# Patient Record
Sex: Male | Born: 1985 | Race: White | Hispanic: No | Marital: Single | Smoking: Current every day smoker
Health system: Southern US, Community
[De-identification: ages and names within clinical notes are randomized; demographics above are authoritative.]

---
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
## 2008-06-15 ENCOUNTER — Emergency Department (HOSPITAL_COMMUNITY): Admission: EM | Admit: 2008-06-15 | Discharge: 2008-06-15 | Payer: Self-pay | Admitting: Emergency Medicine

## 2008-10-03 ENCOUNTER — Ambulatory Visit: Payer: Self-pay | Admitting: Pulmonary Disease

## 2008-10-03 ENCOUNTER — Inpatient Hospital Stay (HOSPITAL_COMMUNITY): Admission: EM | Admit: 2008-10-03 | Discharge: 2008-10-07 | Payer: Self-pay | Admitting: Emergency Medicine

## 2009-01-05 ENCOUNTER — Emergency Department (HOSPITAL_COMMUNITY): Admission: EM | Admit: 2009-01-05 | Discharge: 2009-01-05 | Payer: Self-pay | Admitting: Emergency Medicine

## 2009-01-28 ENCOUNTER — Emergency Department (HOSPITAL_COMMUNITY): Admission: EM | Admit: 2009-01-28 | Discharge: 2009-01-28 | Payer: Self-pay | Admitting: Emergency Medicine

## 2009-05-01 ENCOUNTER — Emergency Department (HOSPITAL_COMMUNITY): Admission: EM | Admit: 2009-05-01 | Discharge: 2009-05-01 | Payer: Self-pay | Admitting: Emergency Medicine

## 2009-08-19 ENCOUNTER — Emergency Department (HOSPITAL_COMMUNITY): Admission: EM | Admit: 2009-08-19 | Discharge: 2009-08-19 | Payer: Self-pay | Admitting: Emergency Medicine

## 2009-11-08 ENCOUNTER — Emergency Department (HOSPITAL_COMMUNITY): Admission: EM | Admit: 2009-11-08 | Discharge: 2009-11-08 | Payer: Self-pay | Admitting: Emergency Medicine

## 2010-01-11 IMAGING — CR DG CHEST 1V PORT
1 series · 1 of 1 positions shown · non-contrast
Comparison: 10/03/2008

CLINICAL DATA: Respiratory distress

PORTABLE CHEST - 1 VIEW

[view not recorded]
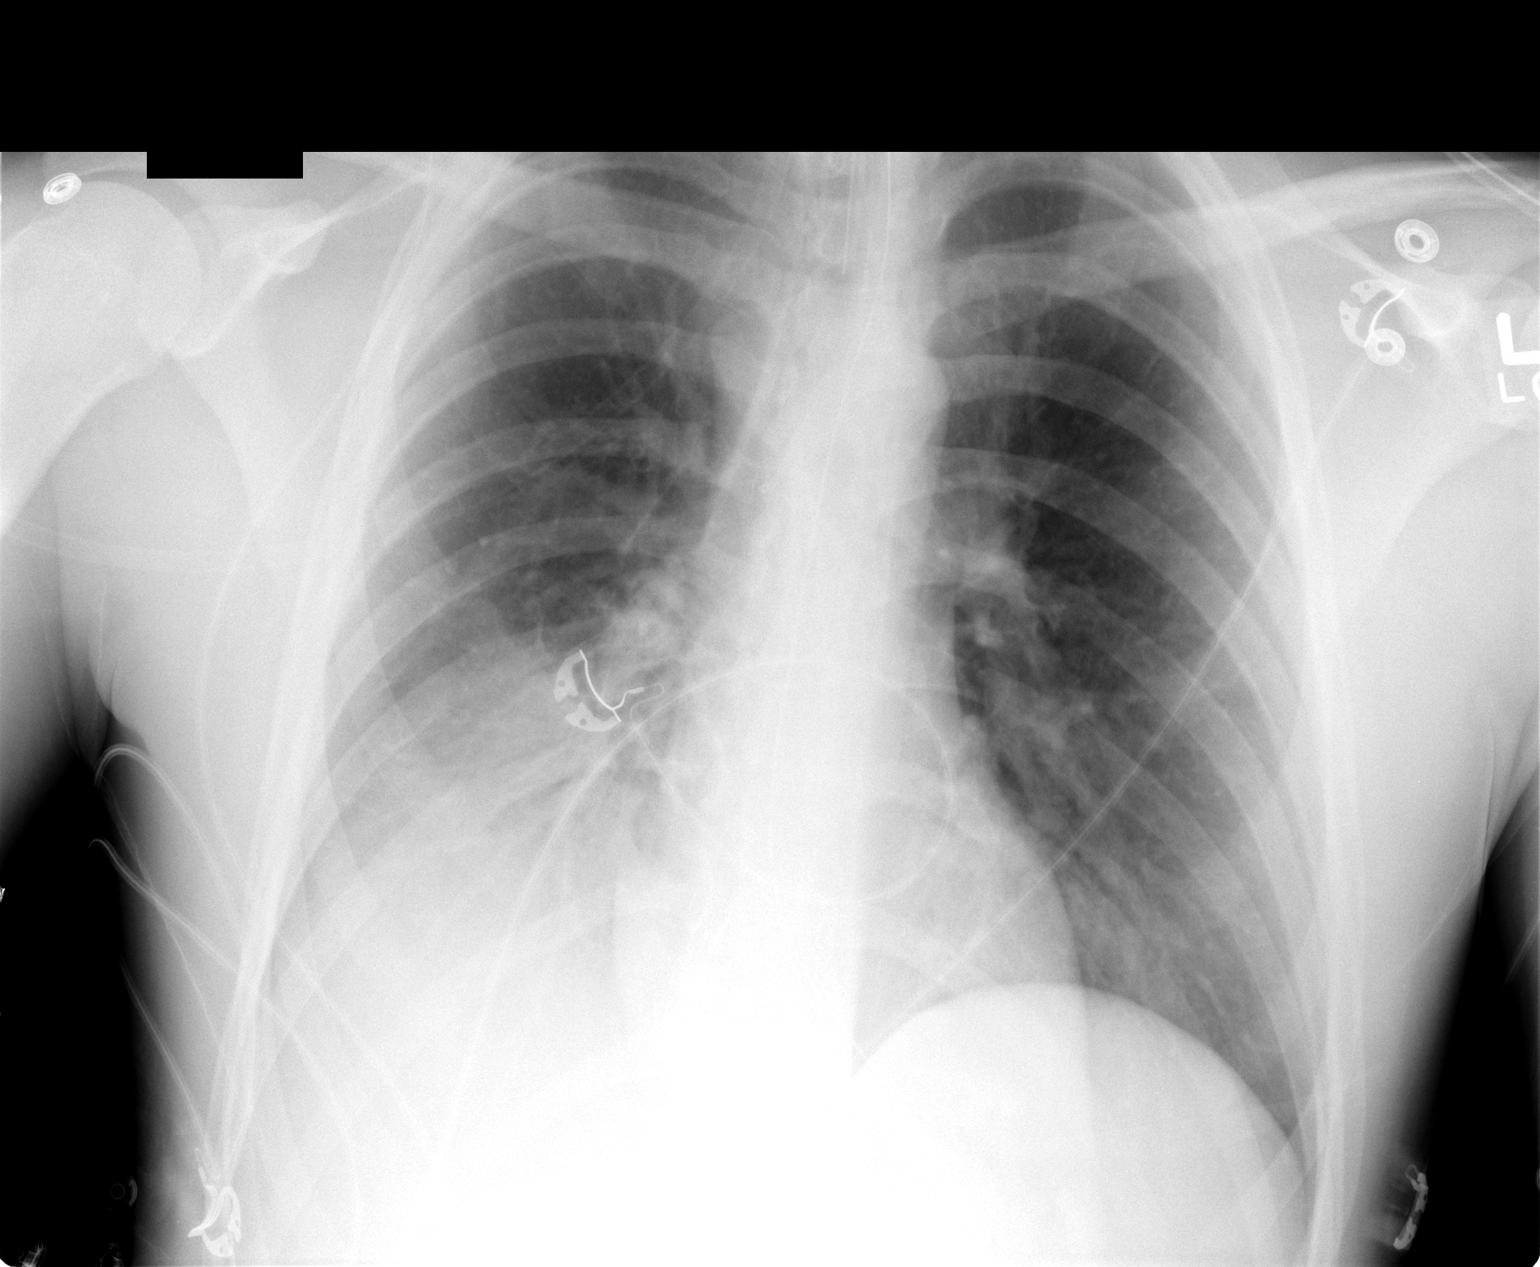

[1 of 1 positions shown; findings below may reference images not displayed]

FINDINGS: Consolidation noted in the right lower lobe compatible
with pneumonia, slightly worsened since prior study.  No focal
opacity on the left.  Heart is normal size. Support devices are
stable.
IMPRESSION: Increasing airspace disease in the right lower lobe, likely
pneumonia.

## 2010-01-12 IMAGING — CR DG CHEST 1V PORT
1 series · 1 of 1 positions shown · non-contrast
Comparison: 10/04/2008

CLINICAL DATA: Respiratory distress.

PORTABLE CHEST - 1 VIEW

[view not recorded]
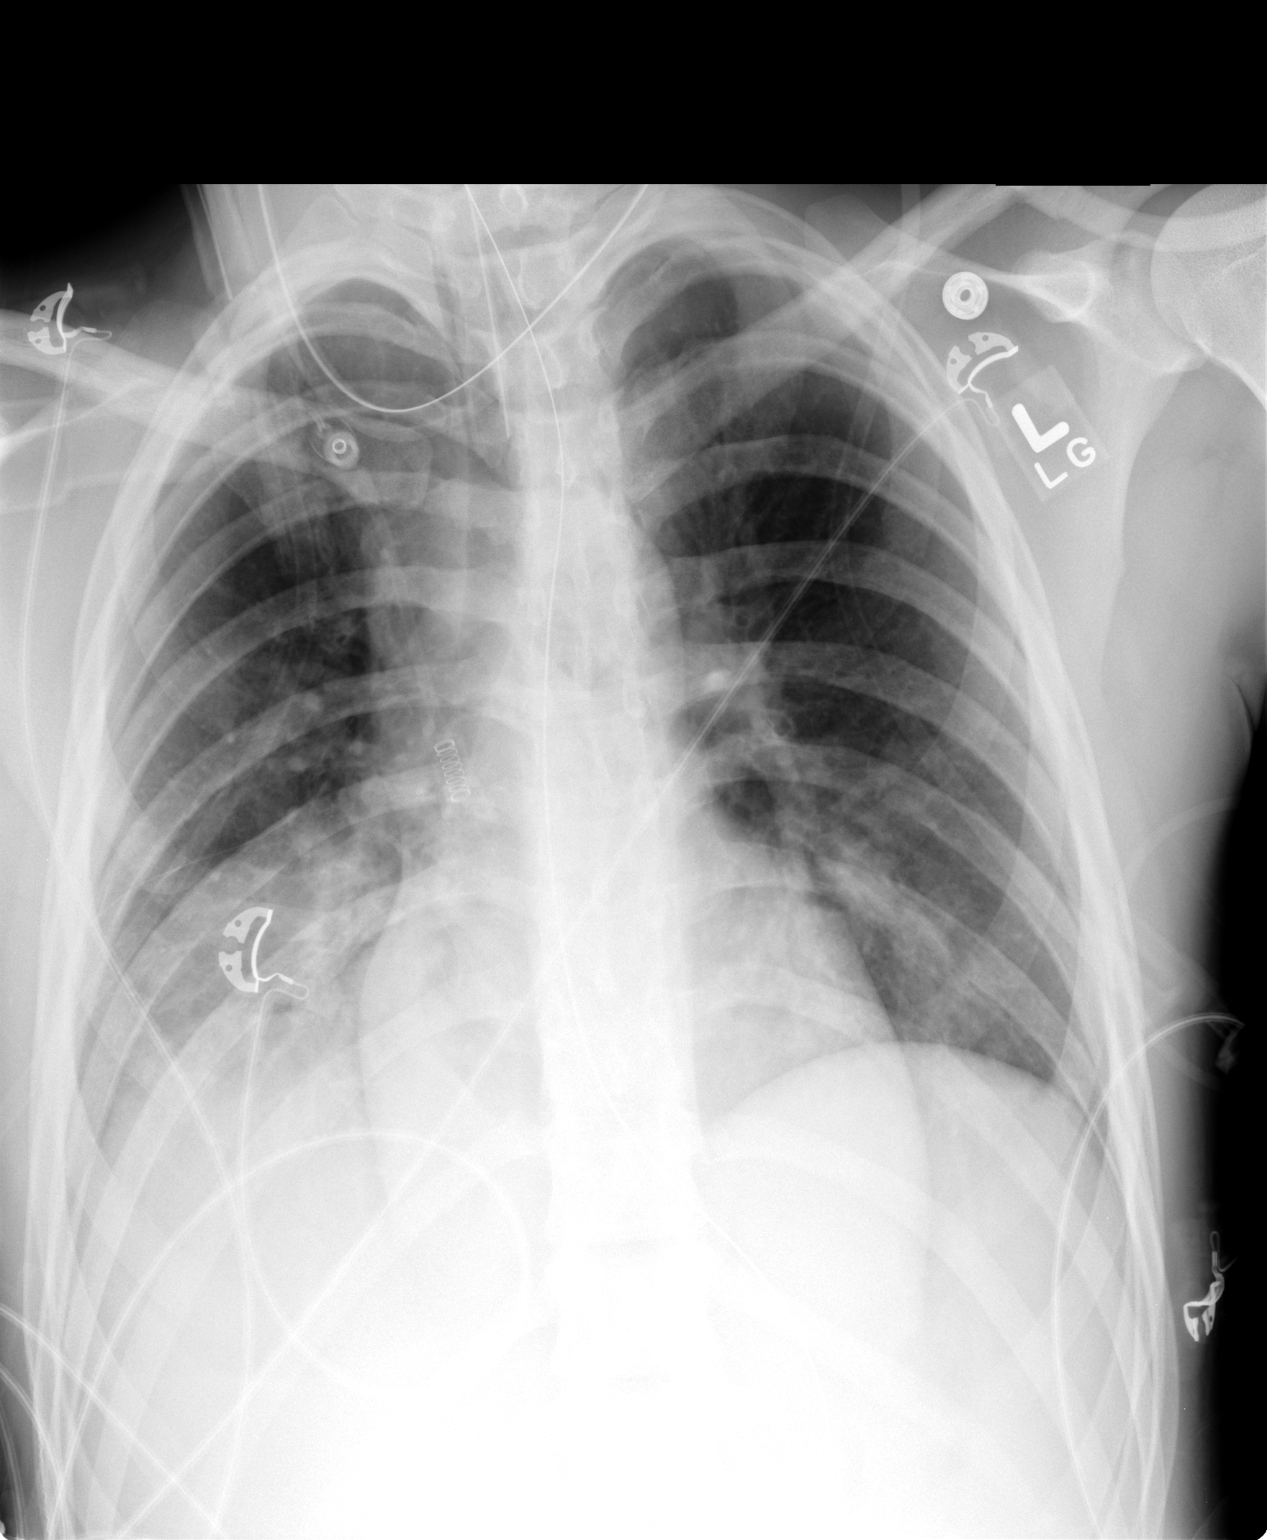

[1 of 1 positions shown; findings below may reference images not displayed]

FINDINGS: Support devices are in stable position.  Continued right
lower lobe consolidation, unchanged.  No focal opacity on the left.
No definite effusions.  Heart is borderline enlarged.
IMPRESSION: No significant change.

## 2010-01-13 IMAGING — CR DG CHEST 1V PORT
1 series · 1 of 1 positions shown · non-contrast
Comparison: 10/05/2008

CLINICAL DATA: Polysubstance abuse.

PORTABLE CHEST - 1 VIEW

[view not recorded]
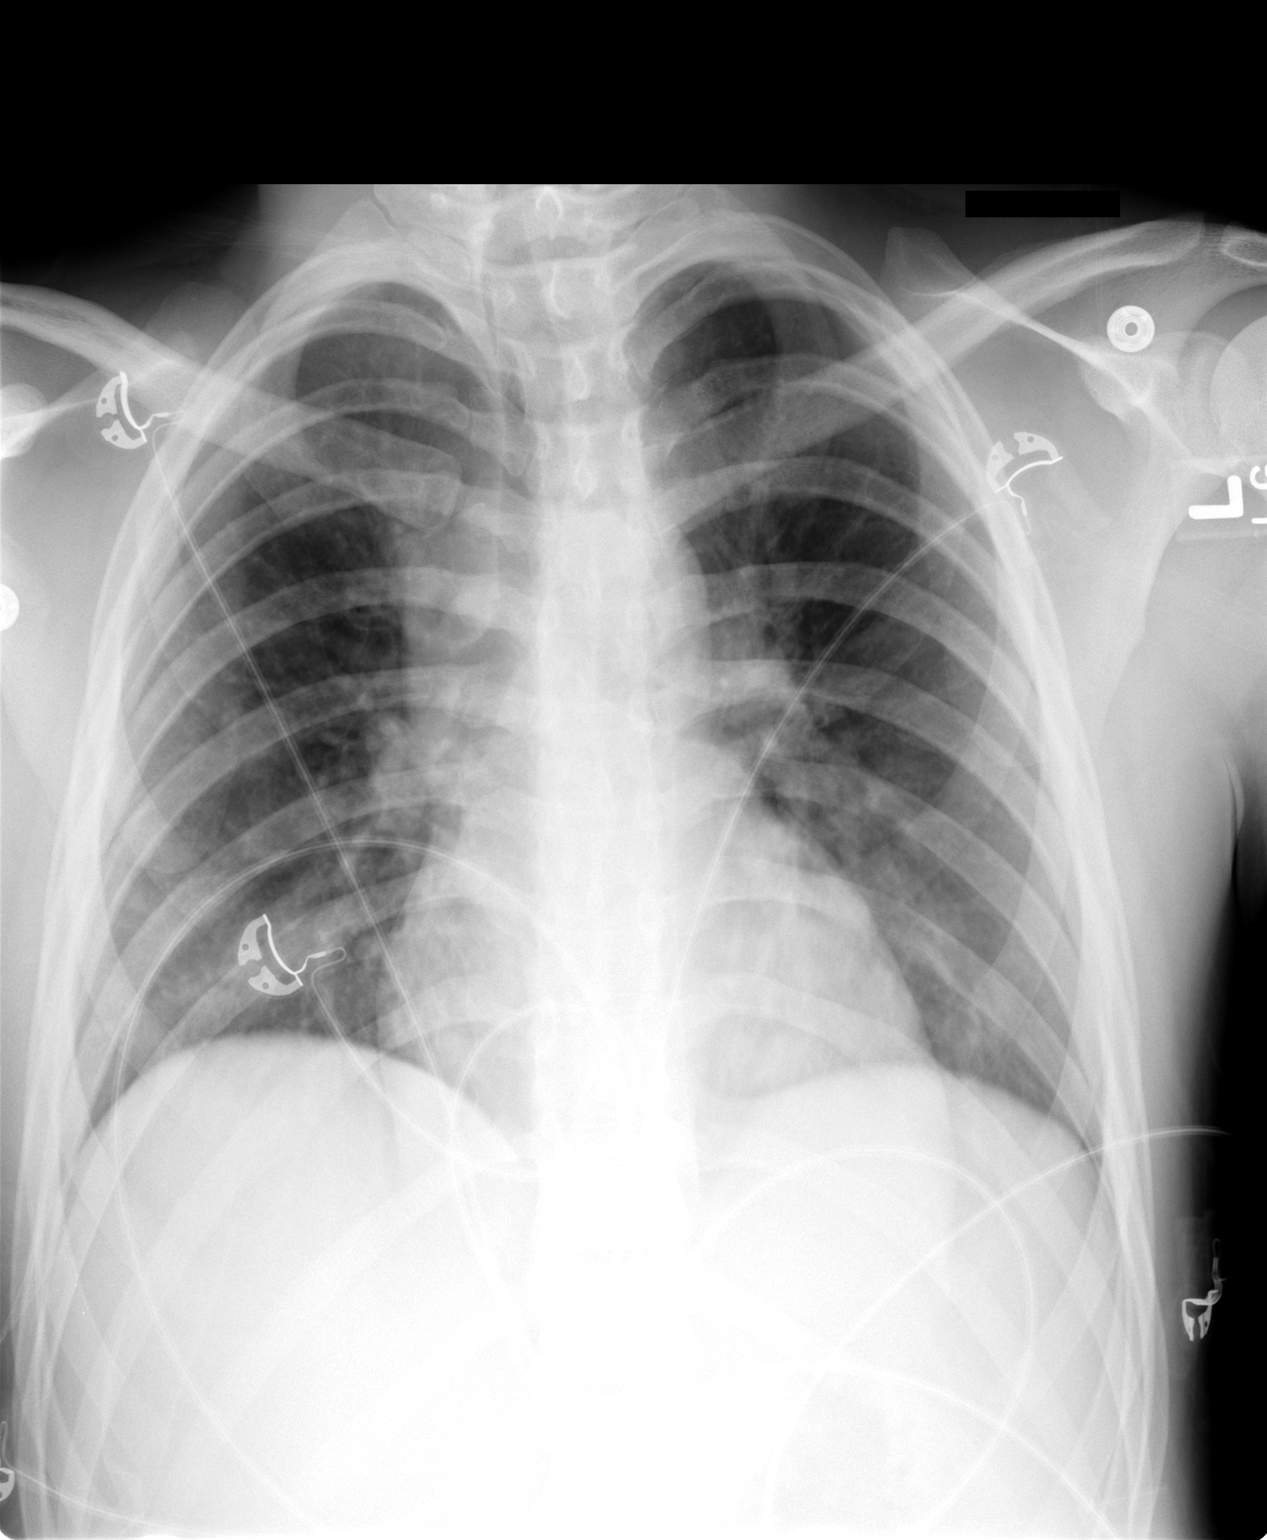

[1 of 1 positions shown; findings below may reference images not displayed]

FINDINGS: Interval extubation.  Improved aeration in the right lung
base.  Minimal bibasilar atelectasis persists.  Heart is upper
limits normal in size.  No effusions.
IMPRESSION: Interval extubation.  Improved aeration in the right base.

## 2010-01-14 IMAGING — CR DG CHEST 1V PORT
2 series · 2 of 2 positions shown · non-contrast
Comparison: 1 day prior

CLINICAL DATA: Respiratory distress.  Alcohol intoxication.
Polysubstance abuse.  Pneumonia.

PORTABLE CHEST - 1 VIEW

[view not recorded (1 of 2)]
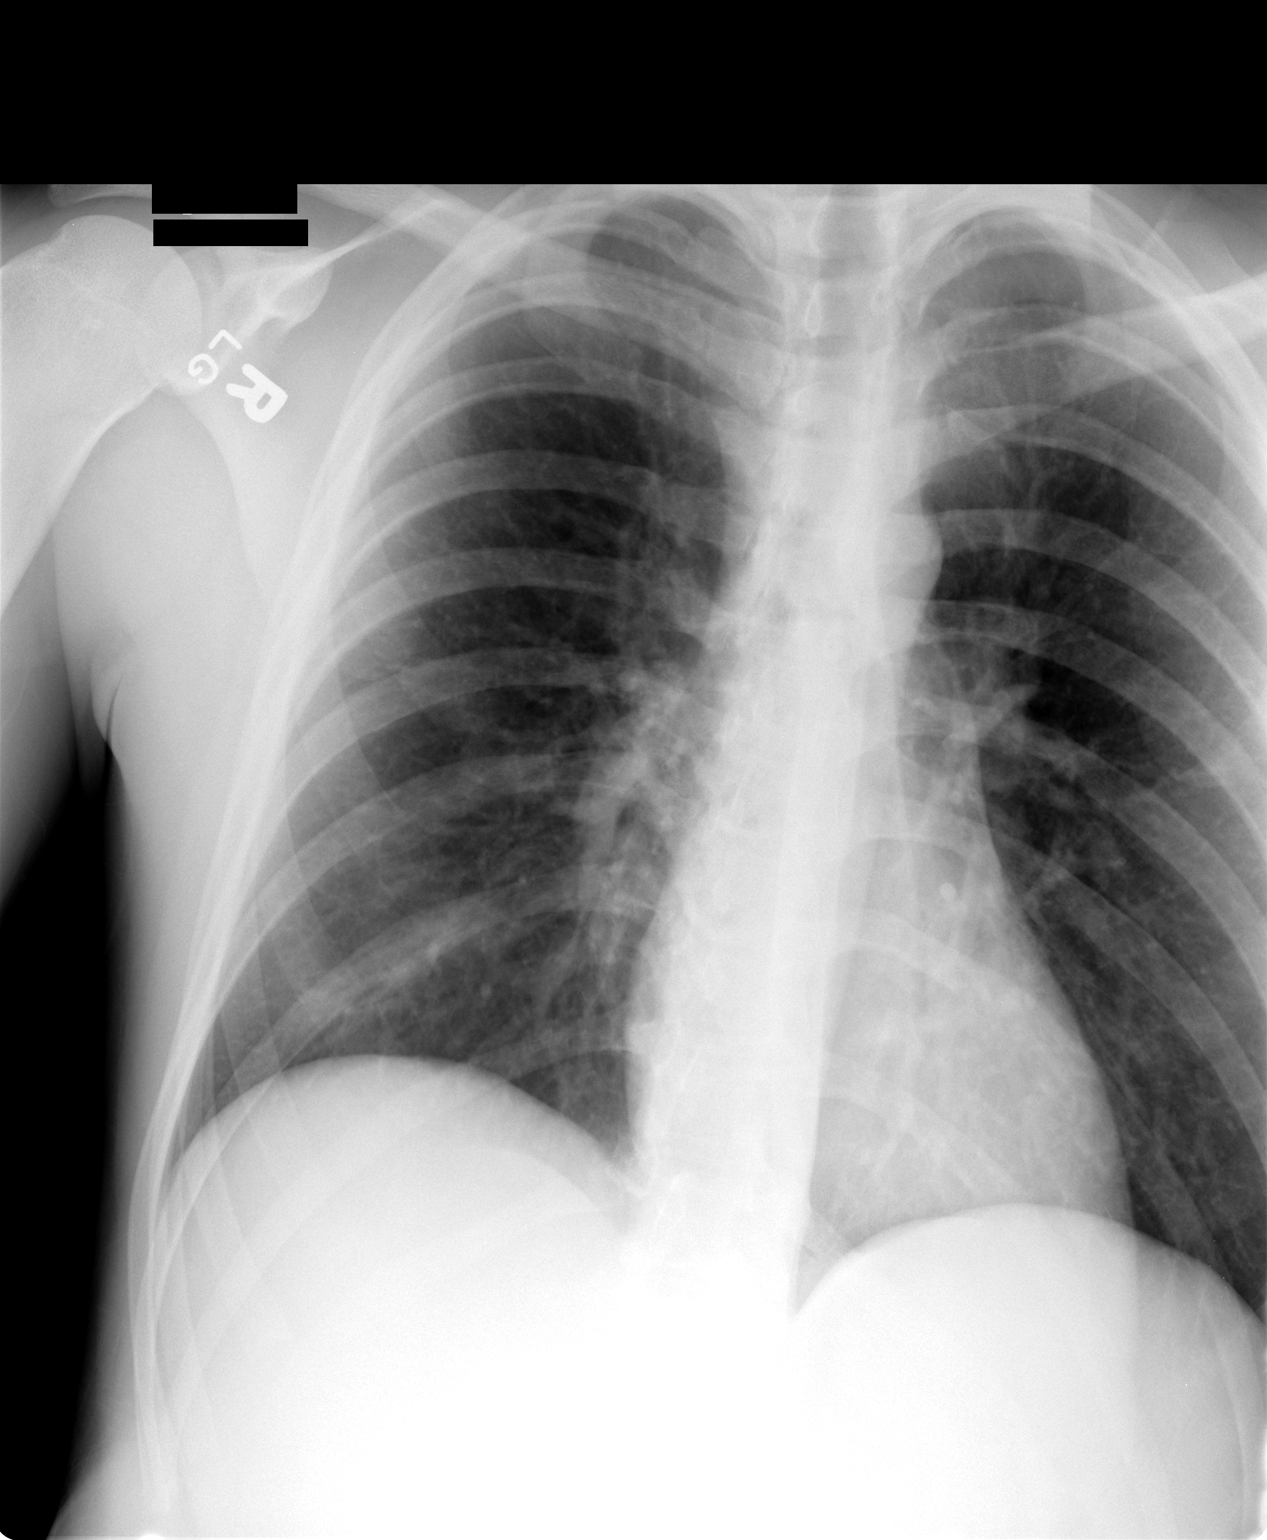

[view not recorded (2 of 2)]
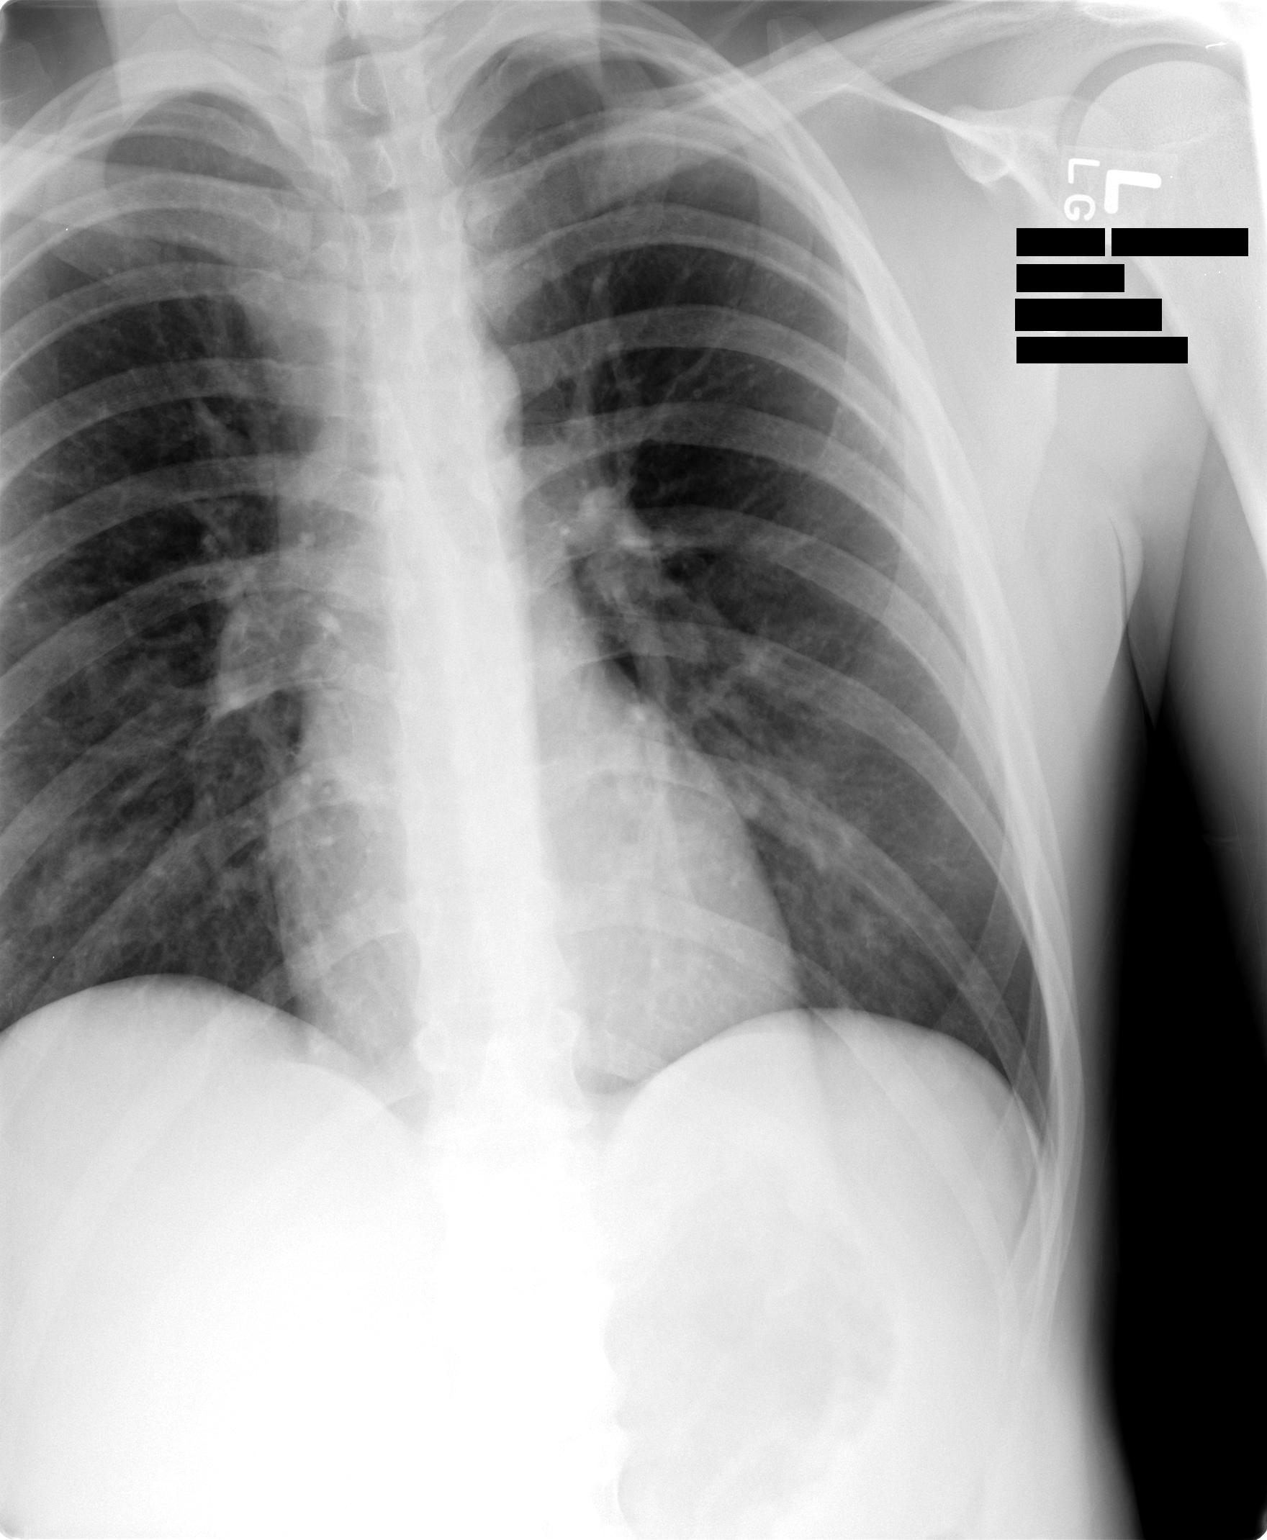

[2 of 2 positions shown; findings below may reference images not displayed]

FINDINGS: 0590 hours.  Midline trachea. Normal heart size and
mediastinal contours. No pleural effusion or pneumothorax. Further
improvement in right lower lobe aeration.  Mild air space disease
remains.  Left lung remains clear.
IMPRESSION: Improved right-sided aeration with minimal right lower lobe
airspace disease remaining.

## 2010-02-06 ENCOUNTER — Emergency Department (HOSPITAL_COMMUNITY): Admission: EM | Admit: 2010-02-06 | Discharge: 2010-02-06 | Payer: Self-pay | Admitting: Emergency Medicine

## 2011-01-08 LAB — BLOOD GAS, ARTERIAL
Acid-base deficit: 3.2 mmol/L — ABNORMAL HIGH (ref 0.0–2.0)
Bicarbonate: 26.2 mEq/L — ABNORMAL HIGH (ref 20.0–24.0)
Drawn by: 229971
FIO2: 0.99 %
FIO2: 1 %
MECHVT: 600 mL
O2 Saturation: 99.8 %
PEEP: 10 cmH2O
Patient temperature: 100.5
RATE: 14 resp/min
TCO2: 19.5 mmol/L (ref 0–100)
TCO2: 22.7 mmol/L (ref 0–100)
pCO2 arterial: 41.3 mmHg (ref 35.0–45.0)
pCO2 arterial: 45.8 mmHg — ABNORMAL HIGH (ref 35.0–45.0)
pO2, Arterial: 276 mmHg — ABNORMAL HIGH (ref 80.0–100.0)
pO2, Arterial: 90.4 mmHg (ref 80.0–100.0)

## 2011-01-08 LAB — POCT I-STAT, CHEM 8
BUN: 9 mg/dL (ref 6–23)
Hemoglobin: 18.7 g/dL — ABNORMAL HIGH (ref 13.0–17.0)
Sodium: 142 mEq/L (ref 135–145)
TCO2: 22 mmol/L (ref 0–100)

## 2011-01-08 LAB — RAPID URINE DRUG SCREEN, HOSP PERFORMED
Amphetamines: NOT DETECTED
Barbiturates: NOT DETECTED
Benzodiazepines: POSITIVE — AB
Tetrahydrocannabinol: POSITIVE — AB

## 2011-01-08 LAB — CBC
HCT: 44.3 % (ref 39.0–52.0)
HCT: 51.7 % (ref 39.0–52.0)
Hemoglobin: 14.4 g/dL (ref 13.0–17.0)
Hemoglobin: 15 g/dL (ref 13.0–17.0)
Hemoglobin: 15.4 g/dL (ref 13.0–17.0)
MCHC: 35.3 g/dL (ref 30.0–36.0)
MCHC: 35.7 g/dL (ref 30.0–36.0)
MCV: 93.1 fL (ref 78.0–100.0)
Platelets: 173 10*3/uL (ref 150–400)
Platelets: 225 10*3/uL (ref 150–400)
RBC: 4.35 MIL/uL (ref 4.22–5.81)
RBC: 4.59 MIL/uL (ref 4.22–5.81)
RBC: 4.69 MIL/uL (ref 4.22–5.81)
RDW: 12.4 % (ref 11.5–15.5)
RDW: 12.6 % (ref 11.5–15.5)
RDW: 12.7 % (ref 11.5–15.5)
WBC: 7.1 10*3/uL (ref 4.0–10.5)
WBC: 7.4 10*3/uL (ref 4.0–10.5)

## 2011-01-08 LAB — BASIC METABOLIC PANEL
CO2: 25 mEq/L (ref 19–32)
Calcium: 8.4 mg/dL (ref 8.4–10.5)
Calcium: 8.6 mg/dL (ref 8.4–10.5)
Chloride: 106 mEq/L (ref 96–112)
GFR calc Af Amer: >60 mL/min (ref >60)
GFR calc non Af Amer: >60 mL/min (ref >60)
Glucose, Bld: 129 mg/dL — ABNORMAL HIGH (ref 70–99)
Potassium: 3.6 mEq/L (ref 3.5–5.1)
Sodium: 137 mEq/L (ref 135–145)
Sodium: 140 mEq/L (ref 135–145)

## 2011-01-08 LAB — CREATININE, SERUM
Creatinine, Ser: 0.91 mg/dL (ref 0.4–1.5)
GFR calc non Af Amer: >60 mL/min (ref >60)

## 2011-01-08 LAB — DIFFERENTIAL
Basophils Absolute: 0 10*3/uL (ref 0.0–0.1)
Eosinophils Relative: 2 % (ref 0–5)
Lymphocytes Relative: 29 % (ref 12–46)
Neutrophils Relative %: 63 % (ref 43–77)

## 2011-01-08 LAB — COMPREHENSIVE METABOLIC PANEL
ALT: 20 U/L (ref 0–53)
Alkaline Phosphatase: 77 U/L (ref 39–117)
CO2: 27 mEq/L (ref 19–32)
Calcium: 9.1 mg/dL (ref 8.4–10.5)
GFR calc non Af Amer: >60 mL/min (ref >60)
Glucose, Bld: 96 mg/dL (ref 70–99)
Sodium: 139 mEq/L (ref 135–145)

## 2011-01-08 LAB — URINALYSIS, ROUTINE W REFLEX MICROSCOPIC
Bilirubin Urine: NEGATIVE
Nitrite: NEGATIVE
Specific Gravity, Urine: 1.025 (ref 1.005–1.030)
pH: 6.5 (ref 5.0–8.0)

## 2011-01-08 LAB — CULTURE, BLOOD (ROUTINE X 2): Culture: NO GROWTH

## 2011-01-08 LAB — CULTURE, RESPIRATORY W GRAM STAIN

## 2011-01-08 LAB — URINE CULTURE

## 2011-01-08 LAB — ETHANOL: Alcohol, Ethyl (B): 382 mg/dL — ABNORMAL HIGH (ref 0–10)

## 2011-01-08 LAB — TRICYCLICS SCREEN, URINE: TCA Scrn: NOT DETECTED

## 2011-02-06 NOTE — Discharge Summary (Signed)
NAMEFINNEAS, Sean Schroeder             ACCOUNT NO.:  [id number]   MEDICAL RECORD NO.:  [id number]          PATIENT TYPE:  INP   LOCATION:  1517                         FACILITY:  [facility]   PHYSICIAN:  Leslye Peer, MD    DATE OF BIRTH:  06-Dec-1985   DATE OF ADMISSION:  10/03/2008  DATE OF DISCHARGE:  10/07/2008                               DISCHARGE SUMMARY   DISCHARGE DIAGNOSES:  1. Acute respiratory failure secondary to aspiration pneumonia      (resolved).  2. Streptococcus pneumoniae pneumonia.  3. Right lower extremity blister.   PROCEDURES:  1. Oral endotracheal tube placed on day of admit, extubated on October 05, 2008.  2. Microbiology strep pneumoniae obtained from sputum bronchioalveolar      lavage on October 03, 2008.  Urine cultures and blood cultures all      negative.   BRIEF HISTORY:  A 25 year old white male patient with no significant  past medical history, brought to the emergency room via EMS after the  patient became unconscious after heavy drinking the night before.  His  initial alcohol level was 382.  Urine drug screen also positive for  cocaine, opiates, and marijuana.  He had a small scalp abrasion, CT head  was negative.  He was intubated for airway protection.  He did have  copious foul-smelling green sputum on presentation with chest x-ray with  right lower lobe airspace disease.   HOSPITAL COURSE BY DISCHARGE DIAGNOSES:  1. Acute respiratory failure secondary to aspiration pneumonia and      strep pneumoniae pneumonia.  As previously mentioned, Sean Schroeder      was admitted to the intensive care, he was initially agitated, on      mechanical ventilation.  He was maintained on mechanical      ventilation until October 05, 2008, at which time, he passed      spontaneous breathing trial successfully and was successfully      extubated and weaned quickly to room air.  He was initially started      on empiric Unasyn, cultures came back as strep  pneumoniae,      antibiotics were initially tapered to Avelox p.o., and he will be      sent home on p.o. Augmentin to complete a total 10-day course      therapy.  Upon time of discharge, his chest x-ray has cleared to      normal, with no pulmonary sequelae.  2. Right lower extremity blister.  The patient had been agitated and      had been kicking at the bed.  He developed a large blister covering      a significant portion of his right lower extremity/heel.  It was      nonerythemic, however, painful to the touch.  Therefore, the site      was prepped with chlorhexidine, and sterilely aspirated with      sterile dressing applied which did result in significant relief in      discomfort.  He was instructed to place sterile 4 x 4s on this  daily, and wear heel shoe until resolved.  He was also instructed,      should it worsen, pain come back, develop fever or chills, or      purulent discharge, to report to either our office or the nearest      Urgent Care.  He has no current primary care Sean Schroeder.  3. Agitated delirium.  This was only during ventilation, and currently      resolved.   DISCHARGE INSTRUCTIONS:  Diet regular.  Activity as tolerated.   DISCHARGE MEDICATIONS:  1. Augment 875/125 one tablet twice a day x5 days.  2. Motrin over the counter 200 mg tablets, instructed to take 4 every      8 hours as needed for pain.   FOLLOWUP:  Nurse practitioner, Sean Schroeder on October 21, 2008, at 9:30.   PHYSICAL EXAMINATION UPON TIME OF DISCHARGE:  GENERAL:  Currently, [name] is hemodynamically stable.  VITAL SIGNS:  He is afebrile with vital signs stable.  HEENT:  Normocephalic.  No JVD or adenopathy.  PULMONARY:  Clear to auscultation.  CARDIAC:  Regular rate and rhythm.  ABDOMEN:  Soft and nontender.  EXTREMITIES:  As noted before with right heel blister which is swollen,  now dressed in sterile 4 x 4.  NEUROLOGIC:  Grossly intact.   DISPOSITION:  He has now maximum  benefit from inpatient stay.  He is  cleared for hospital discharge to home.       Zenia Resides, NP      Leslye Peer, MD  Electronically Signed    PB/MEDQ  D:  10/07/2008  T:  10/08/2008  Job:  [id number]

## 2011-06-25 LAB — RAPID STREP SCREEN (MED CTR MEBANE ONLY): Streptococcus, Group A Screen (Direct): NEGATIVE

## 2011-06-26 ENCOUNTER — Emergency Department (HOSPITAL_COMMUNITY)
Admission: EM | Admit: 2011-06-26 | Discharge: 2011-06-26 | Disposition: A | Discharge status: Home / Self Care | Payer: Self-pay | Attending: Emergency Medicine | Admitting: Emergency Medicine

## 2011-06-26 DIAGNOSIS — F1010 Alcohol abuse, uncomplicated: Secondary | ICD-10-CM | POA: Insufficient documentation

## 2011-06-26 DIAGNOSIS — R4182 Altered mental status, unspecified: Secondary | ICD-10-CM | POA: Insufficient documentation

## 2011-06-26 LAB — BASIC METABOLIC PANEL
BUN: 11 mg/dL (ref 6–23)
CO2: 22 mEq/L (ref 19–32)
Chloride: 102 mEq/L (ref 96–112)
Creatinine, Ser: 1.22 mg/dL (ref 0.50–1.35)
Glucose, Bld: 102 mg/dL — ABNORMAL HIGH (ref 70–99)

## 2011-06-26 LAB — RAPID URINE DRUG SCREEN, HOSP PERFORMED
Benzodiazepines: NOT DETECTED
Cocaine: POSITIVE — AB

## 2011-06-26 LAB — DIFFERENTIAL
Eosinophils Absolute: 0 10*3/uL (ref 0.0–0.7)
Eosinophils Relative: 0 % (ref 0–5)
Lymphs Abs: 1.6 10*3/uL (ref 0.7–4.0)
Monocytes Relative: 6 % (ref 3–12)

## 2011-06-26 LAB — CBC
MCH: 32.8 pg (ref 26.0–34.0)
MCV: 88.7 fL (ref 78.0–100.0)
Platelets: 200 10*3/uL (ref 150–400)
RDW: 12.2 % (ref 11.5–15.5)

## 2011-06-27 ENCOUNTER — Emergency Department (HOSPITAL_COMMUNITY): Payer: Self-pay

## 2011-06-27 ENCOUNTER — Emergency Department (HOSPITAL_COMMUNITY)
Admission: EM | Admit: 2011-06-27 | Discharge: 2011-06-27 | Disposition: A | Discharge status: Home / Self Care | Payer: Self-pay | Attending: Emergency Medicine | Admitting: Emergency Medicine

## 2011-06-27 DIAGNOSIS — R296 Repeated falls: Secondary | ICD-10-CM | POA: Insufficient documentation

## 2011-06-27 DIAGNOSIS — S93409A Sprain of unspecified ligament of unspecified ankle, initial encounter: Secondary | ICD-10-CM | POA: Insufficient documentation

## 2011-06-27 DIAGNOSIS — M25539 Pain in unspecified wrist: Secondary | ICD-10-CM | POA: Insufficient documentation

## 2011-06-27 DIAGNOSIS — IMO0002 Reserved for concepts with insufficient information to code with codable children: Secondary | ICD-10-CM | POA: Insufficient documentation

## 2012-03-12 ENCOUNTER — Emergency Department (HOSPITAL_COMMUNITY)
Admission: EM | Admit: 2012-03-12 | Discharge: 2012-03-12 | Disposition: A | Discharge status: Home / Self Care | Payer: Self-pay | Attending: Emergency Medicine | Admitting: Emergency Medicine

## 2012-03-12 ENCOUNTER — Emergency Department (HOSPITAL_COMMUNITY): Payer: Self-pay

## 2012-03-12 ENCOUNTER — Encounter (HOSPITAL_COMMUNITY): Payer: Self-pay

## 2012-03-12 DIAGNOSIS — W1789XA Other fall from one level to another, initial encounter: Secondary | ICD-10-CM | POA: Insufficient documentation

## 2012-03-12 DIAGNOSIS — S93409A Sprain of unspecified ligament of unspecified ankle, initial encounter: Secondary | ICD-10-CM | POA: Insufficient documentation

## 2012-03-12 MED ORDER — HYDROCODONE-ACETAMINOPHEN 5-325 MG PO TABS: 1.0000 | ORAL_TABLET | Freq: Four times a day (QID) | ORAL | Status: AC | PRN

## 2012-03-12 NOTE — Discharge Instructions (Signed)
Ankle Sprain An ankle sprain is an injury to the strong, fibrous tissues (ligaments) that hold the bones of your ankle joint together.  CAUSES Ankle sprain usually is caused by a fall or by twisting your ankle. People who participate in sports are more prone to these types of injuries.  SYMPTOMS  Symptoms of ankle sprain include:  Pain in your ankle. The pain may be present at rest or only when you are trying to stand or walk.   Swelling.   Bruising. Bruising may develop immediately or within 1 to 2 days after your injury.   Difficulty standing or walking.  DIAGNOSIS  Your caregiver will ask you details about your injury and perform a physical exam of your ankle to determine if you have an ankle sprain. During the physical exam, your caregiver will press and squeeze specific areas of your foot and ankle. Your caregiver will try to move your ankle in certain ways. An X-ray exam may be done to be sure a bone was not broken or a ligament did not separate from one of the bones in your ankle (avulsion).  TREATMENT  Certain types of braces can help stabilize your ankle. Your caregiver can make a recommendation for this. Your caregiver may recommend the use of medication for pain. If your sprain is severe, your caregiver may refer you to a surgeon who helps to restore function to parts of your skeletal system (orthopedist) or a physical therapist. HOME CARE INSTRUCTIONS  Apply ice to your injury for 1 to 2 days or as directed by your caregiver. Applying ice helps to reduce inflammation and pain.  Put ice in a plastic bag.   Place a towel between your skin and the bag.   Leave the ice on for 15 to 20 minutes at a time, every 2 hours while you are awake.   Take over-the-counter or prescription medicines for pain, discomfort, or fever only as directed by your caregiver.   Keep your injured leg elevated, when possible, to lessen swelling.   If your caregiver recommends crutches, use them as  instructed. Gradually, put weight on the affected ankle. Continue to use crutches or a cane until you can walk without feeling pain in your ankle.   If you have a plaster splint, wear the splint as directed by your caregiver. Do not rest it on anything harder than a pillow the first 24 hours. Do not put weight on it. Do not get it wet. You may take it off to take a shower or bath.   You may have been given an elastic bandage to wear around your ankle to provide support. If the elastic bandage is too tight (you have numbness or tingling in your foot or your foot becomes cold and blue), adjust the bandage to make it comfortable.   If you have an air splint, you may blow more air into it or let air out to make it more comfortable. You may take your splint off at night and before taking a shower or bath.   Wiggle your toes in the splint several times per day if you are able.  SEEK MEDICAL CARE IF:   You have an increase in bruising, swelling, or pain.   Your toes feel cold.   Pain relief is not achieved with medication.  SEEK IMMEDIATE MEDICAL CARE IF: Your toes are numb or blue or you have severe pain. MAKE SURE YOU:   Understand these instructions.   Will watch your condition.     Will get help right away if you are not doing well or get worse.  Document Released: 09/10/2005 Document Revised: 08/30/2011 Document Reviewed: 04/14/2008 [employer] Patient Information 2012 [employer]. 

## 2012-03-12 NOTE — Progress Notes (Signed)
Orthopedic Tech Progress Note Patient Details:  Sean Schroeder 07-27-1986 [id number]  Ortho Devices Type of Ortho Device: Crutches;Ankle Air splint Ortho Device/Splint Location: right ankle Ortho Device/Splint Interventions: Application   Asia R Thompson 03/12/2012, 9:43 PM

## 2012-03-12 NOTE — ED Provider Notes (Signed)
History   This chart was scribed for Sean Munch, MD by Shari Heritage. The patient was seen in room TR05C/TR05C. Patient's care was started at 1621.   CSN: [id number]  Arrival date & time 03/12/12  1621   First MD Initiated Contact with Patient 03/12/12 1935      Chief Complaint  Patient presents with  . Fall  . Ankle Pain    (Consider location/radiation/quality/duration/timing/severity/associated sxs/prior treatment) Patient is a 26 y.o. male presenting with fall and ankle pain. The history is provided by the patient. No language interpreter was used.  Fall The accident occurred 3 to 5 hours ago. The fall occurred while walking and while recreating/playing. He fell from a height of 3 to 5 ft. He landed on dirt. The pain is moderate. He was not ambulatory at the scene. There was no entrapment after the fall. Pertinent negatives include no vomiting.  Ankle Pain  The incident occurred 3 to 5 hours ago. The pain is present in the right ankle. The pain is moderate. The pain has been constant since onset.   Sean Schroeder is a 26 y.o. male who presents to the Emergency Department complaining of a fall with associated constant, moderate to severe right ankle pain and swelling. Patient says he stepped into a hole and rolled his ankle several hours ago. Patient denies any other pertinent medical or surgical history.   History reviewed. No pertinent past medical history.  History reviewed. No pertinent past surgical history.  History reviewed. No pertinent family history.  History  Substance Use Topics  . Smoking status: Not on file  . Smokeless tobacco: Not on file  . Alcohol Use: Not on file      Review of Systems  Constitutional:       Per HPI, otherwise negative  HENT:       Per HPI, otherwise negative  Eyes: Negative.   Respiratory:       Per HPI, otherwise negative  Cardiovascular:       Per HPI, otherwise negative  Gastrointestinal: Negative for vomiting.    Genitourinary: Negative.   Musculoskeletal:       Per HPI, otherwise negative  Skin: Negative.   Neurological: Negative for syncope.    Allergies  Review of patient's allergies indicates no known allergies.  Home Medications   Current Outpatient Rx  Name Route Sig Dispense Refill  . HYDROCODONE-ACETAMINOPHEN 5-500 MG PO TABS Oral Take 1 tablet by mouth every 6 (six) hours as needed. For pain    . IBUPROFEN 200 MG PO TABS Oral Take 1,600 mg by mouth every 8 (eight) hours as needed. For pain      BP 140/80  Pulse 113  Temp 98.2 F (36.8 C) (Oral)  Resp 20  SpO2 97%  Physical Exam  Nursing note and vitals reviewed. Constitutional: He is oriented to person, place, and time. He appears well-developed. No distress.  HENT:  Head: Normocephalic and atraumatic.  Eyes: Conjunctivae and EOM are normal.  Cardiovascular: Normal rate and regular rhythm.   Pulmonary/Chest: Effort normal. No stridor. No respiratory distress.  Abdominal: He exhibits no distension.  Musculoskeletal: He exhibits no edema.       Right ankle - Patient has anterior medial and posterior medial malleolus pain.  Neurological: He is alert and oriented to person, place, and time.  Skin: Skin is warm and dry.  Psychiatric: He has a normal mood and affect.    ED Course  Procedures (including critical care time) DIAGNOSTIC STUDIES:  COORDINATION OF CARE: 7:40PM- Patient informed of current plan for treatment and evaluation and agrees with plan at this time. X-ray of right ankle shows no fracture. Will prescribe pain medication and provide air splint and crutches.  Dg Ankle Complete Right  03/12/2012  *RADIOLOGY REPORT*  Clinical Data: Fall, ankle pain  RIGHT ANKLE - COMPLETE 3+ VIEW  Comparison: None.  Findings: Three views of the right ankle submitted.  No acute fracture or subluxation.  Soft tissue swelling noted adjacent to lateral malleolus.  Ankle mortise is preserved.  IMPRESSION: No acute fracture or  subluxation.  Soft tissue swelling adjacent to lateral malleolus.  Original Report Authenticated By: Natasha Mead, M.D.   X-ray reviewed by me  No diagnosis found.    MDM  I personally performed the services described in this documentation, which was scribed in my presence. The recorded information has been reviewed and considered.  This generally well male presents with new pain in his right ankle.  None patient is uncomfortable appearing, with physical exam findings suggestive of sprain.  The patient's x-ray does not demonstrate acute fracture.  The patient was discharged in stable condition after his ankle was immobilized with an air splint.  Sean Munch, MD 03/13/12 [phone]

## 2012-03-12 NOTE — ED Notes (Signed)
Stepped wrong into hole last pm and rolled right ankle. Swelling and pain noted to same

## 2014-05-12 ENCOUNTER — Telehealth: Payer: Self-pay

## 2014-05-12 NOTE — Telephone Encounter (Signed)
Close encounter 

## 2017-10-26 ENCOUNTER — Encounter (HOSPITAL_COMMUNITY): Payer: Self-pay

## 2017-10-26 ENCOUNTER — Emergency Department (HOSPITAL_COMMUNITY): Payer: Self-pay

## 2017-10-26 ENCOUNTER — Emergency Department (HOSPITAL_COMMUNITY)
Admission: EM | Admit: 2017-10-26 | Discharge: 2017-10-26 | Disposition: A | Discharge status: Home / Self Care | Payer: Self-pay | Attending: Emergency Medicine | Admitting: Emergency Medicine

## 2017-10-26 DIAGNOSIS — Y9389 Activity, other specified: Secondary | ICD-10-CM | POA: Insufficient documentation

## 2017-10-26 DIAGNOSIS — S61217A Laceration without foreign body of left little finger without damage to nail, initial encounter: Secondary | ICD-10-CM | POA: Insufficient documentation

## 2017-10-26 DIAGNOSIS — Y9289 Other specified places as the place of occurrence of the external cause: Secondary | ICD-10-CM | POA: Insufficient documentation

## 2017-10-26 DIAGNOSIS — Y999 Unspecified external cause status: Secondary | ICD-10-CM | POA: Insufficient documentation

## 2017-10-26 DIAGNOSIS — X58XXXA Exposure to other specified factors, initial encounter: Secondary | ICD-10-CM | POA: Insufficient documentation

## 2017-10-26 MED ORDER — IBUPROFEN 800 MG PO TABS
800.0000 mg | ORAL_TABLET | Freq: Once | ORAL | Status: AC
  Administered 2017-10-26: 800 mg via ORAL
  Filled 2017-10-26: qty 1

## 2017-10-26 MED ORDER — LIDOCAINE-EPINEPHRINE (PF) 2 %-1:200000 IJ SOLN
20.0000 mL | Freq: Once | INTRAMUSCULAR | Status: DC
  Filled 2017-10-26: qty 20

## 2017-10-26 NOTE — Discharge Instructions (Signed)
Keep area clean and dry. You can wash with soap and water Change bandage at least once daily, more if it is dirty Watch for signs of infection (redness, drainage) Return if worsening

## 2017-10-26 NOTE — ED Notes (Signed)
Dressing apply to little left finger.

## 2017-10-26 NOTE — ED Provider Notes (Signed)
[facility]-EMERGENCY DEPT Provider Note   CSN: [id number] Arrival date & time: 10/26/17  1624     History   Chief Complaint Chief Complaint  Patient presents with  . Laceration    HPI Sean Schroeder is a 32 y.o. male who presents with a laceration of the left pinky finger. No significant PMH. He states that he was using a pressure washer about 1 hour ago when it slipped and hit his left pinky finger. Bleeding was difficult to control. He has associated swelling of the finger. He is able to bend the finger with moderate pain. He has not taken any pain medicine PTA.  HPI  History reviewed. No pertinent past medical history.  There are no active problems to display for this patient.   No past surgical history on file.     Home Medications    Prior to Admission medications   Medication Sig Start Date End Date Taking? Authorizing Provider  HYDROcodone-acetaminophen (VICODIN) 5-500 MG per tablet Take 1 tablet by mouth every 6 (six) hours as needed. For pain    [provider]  ibuprofen (ADVIL,MOTRIN) 200 MG tablet Take 1,600 mg by mouth every 8 (eight) hours as needed. For pain    [provider]    Family History No family history on file.  Social History Social History   Tobacco Use  . Smoking status: Not on file  Substance Use Topics  . Alcohol use: Not on file  . Drug use: Not on file     Allergies   Patient has no known allergies.   Review of Systems Review of Systems  Skin: Positive for wound.  Neurological: Negative for weakness and numbness.     Physical Exam Updated Vital Signs BP 139/89 (BP Location: Right Arm)   Pulse (!) 106   Temp 98.9 F (37.2 C) (Oral)   Resp 20   SpO2 98%   Physical Exam  Constitutional: He is oriented to person, place, and time. He appears well-developed and well-nourished. No distress.  HENT:  Head: Normocephalic and atraumatic.  Eyes: Conjunctivae are normal. Pupils are  equal, round, and reactive to light. Right eye exhibits no discharge. Left eye exhibits no discharge. No scleral icterus.  Neck: Normal range of motion.  Cardiovascular: Normal rate.  Pulmonary/Chest: Effort normal. No respiratory distress.  Abdominal: He exhibits no distension.  Musculoskeletal:  Left pinky finger: Small skin avulsions which are not amenable to suturing. Bleeding is controlled. Decreased ROM of DIP joint due to pain and swelling. N/V intact.   Neurological: He is alert and oriented to person, place, and time.  Skin: Skin is warm and dry.  Psychiatric: He has a normal mood and affect. His behavior is normal.  Nursing note and vitals reviewed.    ED Treatments / Results  Labs (all labs ordered are listed, but only abnormal results are displayed) Labs Reviewed - No data to display  EKG  EKG Interpretation None       Radiology Dg Finger Little Left  Result Date: 10/26/2017 CLINICAL DATA:  Finger laceration along the anterior and lateral aspect from pressure washing today. EXAM: LEFT LITTLE FINGER 2+V COMPARISON:  None. FINDINGS: Moderate diffuse soft tissue emphysema is noted of the left pinky extending into the webspace and level of the fifth MCP and metacarpal. This is presumably on the basis of soft tissue laceration. No underlying osseous abnormality or joint dislocation. No radiopaque foreign body. IMPRESSION: Soft tissue emphysema of the soft tissues  surrounding the left pinky without underlying fracture, joint dislocation or radiopaque foreign body. This is presumably on the basis of soft tissue laceration given patient history. Electronically Signed   By: Tollie Ethavid  Kwon M.D.   On: 10/26/2017 17:50    Procedures Procedures (including critical care time)  Medications Ordered in ED Medications  ibuprofen (ADVIL,MOTRIN) tablet 800 mg (800 mg Oral Given 10/26/17 1840)     Initial Impression / Assessment and Plan / ED Course  I have reviewed the triage vital signs  and the nursing notes.  Pertinent labs & imaging results that were available during my care of the patient were reviewed by me and considered in my medical decision making (see chart for details).  32 year old male with left pinky finger laceration. It was not amenable to suturing. It was irrigated in the ED. Bottom of the wound visualized and bleeding controlled. Wound care discussed. Return precautions discussed.    Final Clinical Impressions(s) / ED Diagnoses   Final diagnoses:  Laceration of left little finger without foreign body without damage to nail, initial encounter    ED Discharge Orders    None       Bethel BornGekas, Dylon Correa Marie, PA-C 10/26/17 [address]el [id number]    Wentz, Elliott, MD 10/26/17 [phone]

## 2017-10-26 NOTE — ED Triage Notes (Addendum)
Patient here with left pinky finger laceration from pressure wash. Pt is rating his pain at 9/10.  Pt state it happen while he was washing a car and trying to get sticker off the car.

## 2022-12-29 ENCOUNTER — Other Ambulatory Visit: Payer: Self-pay

## 2022-12-29 ENCOUNTER — Encounter (HOSPITAL_COMMUNITY): Payer: Self-pay

## 2022-12-29 ENCOUNTER — Emergency Department (HOSPITAL_COMMUNITY)
Admission: EM | Admit: 2022-12-29 | Discharge: 2022-12-29 | Disposition: A | Discharge status: Home / Self Care | Payer: Self-pay | Attending: Emergency Medicine | Admitting: Emergency Medicine

## 2022-12-29 DIAGNOSIS — L02212 Cutaneous abscess of back [any part, except buttock]: Secondary | ICD-10-CM | POA: Insufficient documentation

## 2022-12-29 DIAGNOSIS — L0291 Cutaneous abscess, unspecified: Secondary | ICD-10-CM

## 2022-12-29 MED ORDER — OXYCODONE HCL 5 MG PO TABS
5.0000 mg | ORAL_TABLET | Freq: Once | ORAL | Status: AC
  Administered 2022-12-29: 5 mg via ORAL
  Filled 2022-12-29: qty 1

## 2022-12-29 MED ORDER — LIDOCAINE-EPINEPHRINE-TETRACAINE (LET) TOPICAL GEL
3.0000 mL | Freq: Once | TOPICAL | Status: AC
  Administered 2022-12-29: 3 mL via TOPICAL
  Filled 2022-12-29: qty 3

## 2022-12-29 MED ORDER — LIDOCAINE HCL (PF) 1 % IJ SOLN
10.0000 mL | Freq: Once | INTRAMUSCULAR | Status: AC
  Administered 2022-12-29: 10 mL
  Filled 2022-12-29: qty 10

## 2022-12-29 MED ORDER — DOXYCYCLINE HYCLATE 100 MG PO CAPS: 100.0000 mg | ORAL_CAPSULE | Freq: Two times a day (BID) | ORAL | 0 refills | Status: DC

## 2022-12-29 NOTE — ED Triage Notes (Signed)
Pt c/o abscess on back that started 1 wk ago. Pt noticed today unable to put pressure on it. 10/10

## 2022-12-29 NOTE — Care Management (Addendum)
Consult for Medication assistance. Patient is unemployed and uninsured therefore he qualifies for medication MATCH assistance. Letter and MATCH information attached to DC instructions as this RNCM is remote. RN and provider updated.   MATCH Medication Assistance Card Name: Inocente Clum NG:[id number] [facility]: [id number] RX Group: [id number] Discharge Date:  12/29/2022 Expiration Date:  04/13//2024 (must be filled within 7 days of discharge) Dear ______[name]  Bonita Quin have been approved to have the prescriptions written by your discharging physician filled through our [facility] (Medication Assistance Through [facility]) program. This program allows for a one-time (no refills) 34-day supply of selected medications for a low copay amount.  The copay is $3.00 per prescription. For instance, if you have one prescription, you will pay $3.00; for two prescriptions, you pay $6.00; for three prescriptions, you pay $9.00; and so on.  Only certain pharmacies are participating in this program with [facility]. You will need to select one of the pharmacies from the attached list and take your prescriptions, this letter, and your photo ID to one of the participating pharmacies.   We are excited that you are able to use the [facility] program to get your medications. These prescriptions must be filled within 7 days of hospital discharge or they will no longer be valid for the [facility] program. Should you have any problems with your prescriptions please contact your case management team member at [phone] for [facility].  Thank you, Gilda Crease DNP, MSN, RN [phone]   [facility]    [place] - Locations  [facility]        [address] [place], [place]  [facility]    [address] [place], [place], [place]   [facility]        [address], [employer], [place]  [facility]       [address] [place], [place], [place]  Other [facility] [address], [place], [place]  [facility]                                                                  [address] [place], [place]  [facility]          [address], [place], [place]  [facility] [address], [place], [place]   [address] [place], [place], [place]  [address], [place], [place]  [address]84 [address] [place], [place]   [address] [place], [place]   [address] [place], [place]   [address] [place], [place] [address]40 [address], [place], [place] 63[address], [place], [place]    [facility] [address], [place], [place]  [address], [place], [place] [address], [place], [place]  [address] [place], [place] [address], [place], [place] [address] [place], [place] [address] [place], [place]  [address] [place], [place] 2585 [place], [place], [place] [address], [place], [place] 2202 [address], [place], [place]   [address], [place], [place] [address],  [address], [place] 904 [address], [place], [place] 5005 [address], [place], [place]   [address], [place], [place]   [address], [place], [place] [id number] [place], [place], [place]  1015 [address], [place], [place]  [facility] [address], [place], [place] [id number], [place], [place] [id number] [address], [place], [place] [address], [place], [place] [id number] [address], [place], [place]  [address], [place], [place] [address], [place], [place] 1624 [place] [address], [place], [place] [address], [place], [place] [address], [place], [place] [address] [place], [place], [place] 717 [address], [place], [place] (effective 03/24/17) [address], [place], [place] (effective 03/24/17)

## 2022-12-29 NOTE — ED Provider Notes (Signed)
[place] [facility] AT [facility] Provider Note   CSN: [id number] Arrival date & time: 12/29/22  [id number]     History Chief Complaint  Patient presents with   Abscess    Sean Schroeder is a 37 y.o. male reportedly otherwise healthy presents to the ER for evaluation of an abscess on his back. He denies any h/o this before on his back. Reports it has been worsening over the past 7-10 days. He denies any previous trauma, injury, or bite to the area. He does have a problem with body acne. Denies any urinary or fecal incontinence, fevers, saddle anesthesia, numbness or tingling into the legs, or any history of IV drug use.  Patient does admit to occasional meth use but usually snorts this.  He reports that he is terrified of needles and has never done any IV drugs.  He has not tried to drain the area nor denies that it has been draining.  He denies any medication.  He denies any allergies to any medications.  Admits to smoking but denies any EtOH use.   Abscess Associated symptoms: no fever, no nausea and no vomiting        Home Medications Prior to Admission medications   Medication Sig Start Date End Date Taking? Authorizing Provider  HYDROcodone-acetaminophen (VICODIN) 5-500 MG per tablet Take 1 tablet by mouth every 6 (six) hours as needed. For pain    [provider]  ibuprofen (ADVIL,MOTRIN) 200 MG tablet Take 1,600 mg by mouth every 8 (eight) hours as needed. For pain    [provider]      Allergies    Patient has no known allergies.    Review of Systems   Review of Systems  Constitutional:  Negative for chills and fever.  Gastrointestinal:  Negative for abdominal distention, constipation, diarrhea, nausea and vomiting.       Denies any fecal incontinence  Genitourinary:  Negative for dysuria and hematuria.       Denies any urinary incontinence or urinary retention.  Musculoskeletal:  Negative for back pain.       Denies any saddle  anesthesia  Skin:  Positive for wound.  Neurological:  Negative for weakness and numbness.    Physical Exam Updated Vital Signs BP 122/73   Pulse 89   Temp 97.7 F (36.5 C)   Resp 16   Ht 6\' 3"  (1.905 m)   Wt 99.8 kg   SpO2 97%   BMI 27.50 kg/m  Physical Exam Vitals and nursing note reviewed.  Constitutional:      General: He is not in acute distress.    Appearance: Normal appearance. He is not toxic-appearing.  Eyes:     General: No scleral icterus. Pulmonary:     Effort: Pulmonary effort is normal. No respiratory distress.  Musculoskeletal:       Arms:     Comments: Approximately 4.5cm x 6cm area of erythema and induration, lateral on the left to the spine. Some increase in warmth. Some central fluctuance noted with a central pustule.  No red streaking noted.  Painful to palpation.  Patient also has numerous acne scars on his back as well as small comedone like pustules.  Skin:    General: Skin is dry.     Findings: No rash.  Neurological:     General: No focal deficit present.     Mental Status: He is alert. Mental status is at baseline.  Psychiatric:  Mood and Affect: Mood normal.     ED Results / Procedures / Treatments   Labs (all labs ordered are listed, but only abnormal results are displayed) Labs Reviewed - No data to display  EKG None  Radiology No results found.  Procedures .Marland KitchenIncision and Drainage  Date/Time: 12/29/2022 9:50 AM  Performed by: Achille Rich, PA-C Authorized by: Achille Rich, PA-C   Consent:    Consent obtained:  Verbal   Consent given by:  Patient   Risks, benefits, and alternatives were discussed: yes     Risks discussed:  Bleeding, incomplete drainage, pain, damage to other organs and infection   Alternatives discussed:  No treatment, delayed treatment and alternative treatment (Discussed using warm compresses, I&D, and antibiotic use only.  Patient like to proceed with doing the I&D.) Universal protocol:    Patient  identity confirmed:  Verbally with patient Location:    Type:  Abscess   Location:  Trunk   Trunk location:  Back Pre-procedure details:    Skin preparation:  Betadine Anesthesia:    Anesthesia method:  Local infiltration and topical application   Topical anesthetic:  LET   Local anesthetic:  Lidocaine 1% w/o epi Procedure type:    Complexity:  Simple Procedure details:    Incision types:  Single straight   Incision depth:  Subcutaneous   Wound management:  Probed and deloculated and extensive cleaning   Drainage:  Purulent and bloody   Drainage amount:  Moderate Post-procedure details:    Procedure completion:  Tolerated well, no immediate complications    Medications Ordered in ED Medications  lidocaine (PF) (XYLOCAINE) 1 % injection 10 mL (10 mLs Other Given 12/29/22 0740)  oxyCODONE (Oxy IR/ROXICODONE) immediate release tablet 5 mg (5 mg Oral Given 12/29/22 0747)  lidocaine-EPINEPHrine-tetracaine (LET) topical gel (3 mLs Topical Given 12/29/22 3838)    ED Course/ Medical Decision Making/ A&P                            Medical Decision Making Risk Prescription drug management.   37 y.o. male presents to the ER today for evaluation of back abscess. Differential diagnosis includes but is not limited to cyst, abscess, cellulitis, epidural abscess, spinal infection. Vital signs unremarkable. Physical exam as noted above.   The area appears more to be an inflamed acne cyst than an abscess.  Was able to do an ultrasound that showed some cystic debris as well as a small amount of hypoechoic area just underneath the central pustule.  I discussed the risk and benefits of the procedure as well as offered no treatment and alternative treatments to the patient.  He would like to proceed with the incision and drainage.  Please see procedure note for additional information.  I do not think that this is involving the spine or spinal cord.  It is lateral to the midline he does not have any  midline tenderness palpation.  He does not have any back red flag symptoms.  He is afebrile with stable vital signs.  He has significant acne scars to his back, this is likely an inflamed acne cyst.  Will place him on antibiotic and discussed with him the adherence to the antibiotic as well as strict cleaning to make sure this area does not get infected.  Asked that he come back to the emergency department for reevaluation of his wound. TOC consult placed for medication assistance and was able to get the medication for  a significantly reduced price.   We discussed plan at bedside. We discussed strict return precautions and red flag symptoms. The patient verbalized their understanding and agrees to the plan. The patient is stable and being discharged home in good condition.  I discussed this case with my attending physician who cosigned this note including patient's presenting symptoms, physical exam, and planned diagnostics and interventions. Attending physician stated agreement with plan or made changes to plan which were implemented.   Portions of this report may have been transcribed using voice recognition software. Every effort was made to ensure accuracy; however, inadvertent computerized transcription errors may be present.   Final Clinical Impression(s) / ED Diagnoses Final diagnoses:  Abscess    Rx / DC Orders ED Discharge Orders          Ordered    doxycycline (VIBRAMYCIN) 100 MG capsule  2 times daily        12/29/22 0938              Achille RichRansom, Hanny Elsberry, PA-C 12/29/22 1000    Derwood KaplanNanavati, Ankit, MD 12/30/22 1103

## 2022-12-29 NOTE — Discharge Instructions (Addendum)
You were seen in the emergency room today for evaluation of the abscess on your back.  I likely think this is more of an inflamed cyst from an acne mark.  We were able to drain some of this for you.  Please make sure you are keeping this clean at least once daily with soap and water.  I would leave it covered as well till it forms a scab.  You can keep warm compresses on the area as well.  You can take 600 mg of ibuprofen and 1000 mg of Tylenol every 6 hours as needed for pain.  Please come back in 2 days for wound reevaluation.  I have included the GoodRx card for discounted medication cost at [employer] as well as provide you a printed prescription.  He can take this to the pharmacy of your choice.  I have also consulted our social work to see if they can help you with setting up primary care as well as with your medications.  If you have any worsening pain, fever, red streaking from the area, any trouble with your bowel or bladder, numbness tingling in the legs, back pain, please return to the nearest emergency department for evaluation.  If you have any concerns, new or worsening symptoms, please return to the nearest emergency department for evaluation.  Contact a health care provider if: Your cyst or abscess comes back. You have any signs of infection. You notice red streaks that spread away from the incision site. You have a fever or chills. Get help right away if: You have severe pain or bleeding. You become short of breath. You have chest pain. You have signs of a severe infection. You may notice changes in your incision area, such as: Swelling that makes the skin feel hard. Numbness or tingling. Sudden increase in redness. Your skin color may change from red to purple, and then to dark spots. Blisters, ulcers, or splitting of the skin. These symptoms may be an emergency. Get help right away. Call 911. Do not wait to see if the symptoms will go away. Do not drive yourself to the  hospital.  [facility] Medication Assistance Card Name: Sean Schroeder AP:[id number] [facility]: [id number] RX Group: [id number] Discharge Date:  12/29/2022 Expiration Date:  04/13//2024 (must be filled within 7 days of discharge Dear ______[name]  Bonita Quin have been approved to have the prescriptions written by your discharging physician filled through our [facility] (Medication Assistance Through [facility]) program. This program allows for a one-time (no refills) 34-day supply of selected medications for a low copay amount.  The copay is $3.00 per prescription. For instance, if you have one prescription, you will pay $3.00; for two prescriptions, you pay $6.00; for three prescriptions, you pay $9.00; and so on.  Only certain pharmacies are participating in this program with [facility]. You will need to select one of the pharmacies from the attached list and take your prescriptions, this letter, and your photo ID to one of the participating pharmacies.   We are excited that you are able to use the [facility] program to get your medications. These prescriptions must be filled within 7 days of hospital discharge or they will no longer be valid for the [facility] program. Should you have any problems with your prescriptions please contact your case management team member at [phone] for [facility].  Thank you, Gilda Crease DNP, MSN, RN [phone]   [facility]    [place] - Locations  [facility]        [address], [place], [place]  [facility]    [address] [place], [place], [place]   [facility]        [address], [employer], [place]  [facility]       [address] [place], [place], [place]  Other [facility] [address], [place], [place]  [facility]                                                                   [address], [place], [place]  [facility]          [address], [place], [place]  [facility] [address], [place], [place]   [address] [place], [place], [place]  [address] [place], [place]  [address], [place], [place]   6147 [address], [place], [place]   [address] [place], [place]   [address], [place], [place] [address]40 [address], [place], [place] [address], [place], [place]    [facility] [address] [place], [place], [place]  [address], [place], [place] [address], [place], [place]  3703 [address], [place], [place] [address] [place], [place] 300 [address] [place], [place] 7[address] [place], [place]  [address] [place], [place] 2585 [place], [place], [place] [address], [place], [place] 4383 [address], [place], [place]   [address], [place], [place] 2758 [address], [place], [place] 904 [address], [place], [place] [address], [place], [place]   407 [address] [place], [place]   [address], [place], [place] [address] [place], [place], [place]  101[address] [place], [place]  [facility] [address] [place], [place], [place] 4360 [address] [place], [place], [place] 6770 [address] [place], [place] 121 [address] [place], [place] 3403 [address], [place], [place]  [address] [place] [address], [place], [place] 1021 [address], [place], [place] 1624 [place] [address] [place], [place], [place] 6711 [address], [place], [place] [address] [place], [place] [address] [place], [place], [place] 717 [address], [place], [place] (effective 03/24/17) [address] [place], [place] (effective 03/24/17)

## 2023-03-31 ENCOUNTER — Encounter (HOSPITAL_BASED_OUTPATIENT_CLINIC_OR_DEPARTMENT_OTHER): Payer: Self-pay

## 2023-03-31 ENCOUNTER — Other Ambulatory Visit: Payer: Self-pay

## 2023-03-31 ENCOUNTER — Emergency Department (HOSPITAL_BASED_OUTPATIENT_CLINIC_OR_DEPARTMENT_OTHER)
Admission: EM | Admit: 2023-03-31 | Discharge: 2023-03-31 | Disposition: A | Discharge status: Home / Self Care | Payer: BLUE CROSS/BLUE SHIELD | Attending: Emergency Medicine | Admitting: Emergency Medicine

## 2023-03-31 DIAGNOSIS — T300 Burn of unspecified body region, unspecified degree: Secondary | ICD-10-CM | POA: Diagnosis not present

## 2023-03-31 DIAGNOSIS — L03011 Cellulitis of right finger: Secondary | ICD-10-CM | POA: Diagnosis not present

## 2023-03-31 DIAGNOSIS — M79644 Pain in right finger(s): Secondary | ICD-10-CM | POA: Diagnosis present

## 2023-03-31 MED ORDER — CEPHALEXIN 500 MG PO CAPS: 500.0000 mg | ORAL_CAPSULE | Freq: Four times a day (QID) | ORAL | 0 refills | Status: DC

## 2023-03-31 MED ORDER — CEPHALEXIN 250 MG PO CAPS
500.0000 mg | ORAL_CAPSULE | Freq: Once | ORAL | Status: AC
  Administered 2023-03-31: 500 mg via ORAL
  Filled 2023-03-31: qty 2
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
## 2023-07-17 ENCOUNTER — Emergency Department (HOSPITAL_BASED_OUTPATIENT_CLINIC_OR_DEPARTMENT_OTHER): Payer: BLUE CROSS/BLUE SHIELD

## 2023-07-17 ENCOUNTER — Other Ambulatory Visit: Payer: Self-pay

## 2023-07-17 ENCOUNTER — Emergency Department (HOSPITAL_BASED_OUTPATIENT_CLINIC_OR_DEPARTMENT_OTHER)
Admission: EM | Admit: 2023-07-17 | Discharge: 2023-07-17 | Disposition: A | Discharge status: Home / Self Care | Payer: BLUE CROSS/BLUE SHIELD | Attending: Emergency Medicine | Admitting: Emergency Medicine

## 2023-07-17 ENCOUNTER — Encounter (HOSPITAL_BASED_OUTPATIENT_CLINIC_OR_DEPARTMENT_OTHER): Payer: Self-pay | Admitting: *Deleted

## 2023-07-17 DIAGNOSIS — M79651 Pain in right thigh: Secondary | ICD-10-CM | POA: Diagnosis present

## 2023-07-17 DIAGNOSIS — L03115 Cellulitis of right lower limb: Secondary | ICD-10-CM | POA: Insufficient documentation

## 2023-07-17 DIAGNOSIS — R238 Other skin changes: Secondary | ICD-10-CM

## 2023-07-17 LAB — CBC WITH DIFFERENTIAL/PLATELET
Abs Immature Granulocytes: 0.01 10*3/uL (ref 0.00–0.07)
Basophils Absolute: 0 10*3/uL (ref 0.0–0.1)
Basophils Relative: 0 %
Eosinophils Absolute: 0.2 10*3/uL (ref 0.0–0.5)
Eosinophils Relative: 4 %
HCT: 40.4 % (ref 39.0–52.0)
Hemoglobin: 14.8 g/dL (ref 13.0–17.0)
Immature Granulocytes: 0 %
Lymphocytes Relative: 25 %
Lymphs Abs: 1.7 10*3/uL (ref 0.7–4.0)
MCH: 32.8 pg (ref 26.0–34.0)
MCHC: 36.6 g/dL — ABNORMAL HIGH (ref 30.0–36.0)
MCV: 89.6 fL (ref 80.0–100.0)
Monocytes Absolute: 0.6 10*3/uL (ref 0.1–1.0)
Monocytes Relative: 9 %
Neutro Abs: 4.3 10*3/uL (ref 1.7–7.7)
Neutrophils Relative %: 62 %
Platelets: 198 10*3/uL (ref 150–400)
RBC: 4.51 MIL/uL (ref 4.22–5.81)
RDW: 11.9 % (ref 11.5–15.5)
WBC: 6.9 10*3/uL (ref 4.0–10.5)
nRBC: 0 % (ref 0.0–0.2)

## 2023-07-17 LAB — HEPATIC FUNCTION PANEL
ALT: 22 U/L (ref 0–44)
AST: 17 U/L (ref 15–41)
Albumin: 4 g/dL (ref 3.5–5.0)
Alkaline Phosphatase: 96 U/L (ref 38–126)
Bilirubin, Direct: 0.1 mg/dL (ref 0.0–0.2)
Indirect Bilirubin: 0.7 mg/dL (ref 0.3–0.9)
Total Bilirubin: 0.8 mg/dL (ref 0.3–1.2)
Total Protein: 6.8 g/dL (ref 6.5–8.1)

## 2023-07-17 LAB — BASIC METABOLIC PANEL
Anion gap: 8 (ref 5–15)
BUN: 13 mg/dL (ref 6–20)
CO2: 30 mmol/L (ref 22–32)
Calcium: 8.8 mg/dL — ABNORMAL LOW (ref 8.9–10.3)
Chloride: 100 mmol/L (ref 98–111)
Creatinine, Ser: 0.96 mg/dL (ref 0.61–1.24)
GFR, Estimated: >60 mL/min (ref >60)
Glucose, Bld: 90 mg/dL (ref 70–99)
Potassium: 3.5 mmol/L (ref 3.5–5.1)
Sodium: 138 mmol/L (ref 135–145)

## 2023-07-17 MED ORDER — DOXYCYCLINE HYCLATE 100 MG PO TABS
100.0000 mg | ORAL_TABLET | Freq: Once | ORAL | Status: AC
  Administered 2023-07-17: 100 mg via ORAL
  Filled 2023-07-17: qty 1

## 2023-07-17 MED ORDER — ACETAMINOPHEN 500 MG PO TABS
1000.0000 mg | ORAL_TABLET | Freq: Once | ORAL | Status: AC
  Administered 2023-07-17: 1000 mg via ORAL
  Filled 2023-07-17: qty 2

## 2023-07-17 MED ORDER — IBUPROFEN 800 MG PO TABS
800.0000 mg | ORAL_TABLET | Freq: Once | ORAL | Status: AC
  Administered 2023-07-17: 800 mg via ORAL
  Filled 2023-07-17: qty 1

## 2023-07-17 MED ORDER — IOHEXOL 350 MG/ML SOLN
100.0000 mL | Freq: Once | INTRAVENOUS | Status: AC | PRN
  Administered 2023-07-17: 100 mL via INTRAVENOUS

## 2023-07-17 MED ORDER — DOXYCYCLINE HYCLATE 100 MG PO CAPS: 100.0000 mg | ORAL_CAPSULE | Freq: Two times a day (BID) | ORAL | 0 refills | Status: AC

## 2023-07-17 MED ORDER — MORPHINE SULFATE (PF) 4 MG/ML IV SOLN
4.0000 mg | Freq: Once | INTRAVENOUS | Status: AC
  Administered 2023-07-17: 4 mg via INTRAVENOUS
  Filled 2023-07-17: qty 1

## 2023-07-17 NOTE — ED Provider Notes (Signed)
[place] [facility] AT [facility] Provider Note   CSN: [id number] Arrival date & time: 07/17/23  0865     History  Chief Complaint  Patient presents with   Abscess    Sean Schroeder is a 37 y.o. male.  Patient with history of abscess in his face, uses methamphetamine presents with worsening redness to right groin and raw skin between toes on the right.  Patient admits he does not air out his feet commonly.  No history of diabetes.  Patient has had abscess in his face in the past.  Patient's had tenderness and redness to the right thigh for about 1 week.  No fevers chills or vomiting.  The history is provided by the patient.  Abscess Associated symptoms: no fever, no headaches and no vomiting        Home Medications Prior to Admission medications   Medication Sig Start Date End Date Taking? Authorizing Provider  doxycycline (VIBRAMYCIN) 100 MG capsule Take 1 capsule (100 mg total) by mouth 2 (two) times daily. One po bid x 7 days 07/17/23  Yes Blane Ohara, MD      Allergies    Patient has no known allergies.    Review of Systems   Review of Systems  Constitutional:  Negative for chills and fever.  HENT:  Negative for congestion.   Eyes:  Negative for visual disturbance.  Respiratory:  Negative for shortness of breath.   Cardiovascular:  Negative for chest pain.  Gastrointestinal:  Negative for abdominal pain and vomiting.  Genitourinary:  Negative for dysuria and flank pain.  Musculoskeletal:  Negative for back pain, neck pain and neck stiffness.  Skin:  Positive for rash.  Neurological:  Negative for light-headedness and headaches.    Physical Exam Updated Vital Signs BP (!) 135/96   Pulse 73   Temp 97.7 F (36.5 C) (Oral)   Resp 14   SpO2 97%  Physical Exam Vitals and nursing note reviewed.  Constitutional:      General: He is not in acute distress.    Appearance: He is well-developed.  HENT:     Head: Normocephalic and  atraumatic.     Mouth/Throat:     Mouth: Mucous membranes are moist.  Eyes:     General:        Right eye: No discharge.        Left eye: No discharge.     Conjunctiva/sclera: Conjunctivae normal.  Neck:     Trachea: No tracheal deviation.  Cardiovascular:     Rate and Rhythm: Normal rate.  Pulmonary:     Effort: Pulmonary effort is normal.  Abdominal:     General: There is no distension.     Palpations: Abdomen is soft.     Tenderness: There is no abdominal tenderness. There is no guarding.  Musculoskeletal:     Cervical back: Normal range of motion and neck supple. No rigidity.  Skin:    General: Skin is warm.     Capillary Refill: Capillary refill takes less than 2 seconds.     Findings: Erythema and rash present.     Comments: Patient has approximately 15 cm of erythema, warmth anterior/medial proximal right thigh and significant tenderness to proximal aspect closer to inguinal ligament with mild induration.  No crepitus.  Neurological:     General: No focal deficit present.     Mental Status: He is alert.     Cranial Nerves: No cranial nerve deficit.  Psychiatric:  Mood and Affect: Mood normal.     ED Results / Procedures / Treatments   Labs (all labs ordered are listed, but only abnormal results are displayed) Labs Reviewed  BASIC METABOLIC PANEL - Abnormal; Notable for the following components:      Result Value   Calcium 8.8 (*)    All other components within normal limits  CBC WITH DIFFERENTIAL/PLATELET - Abnormal; Notable for the following components:   MCHC 36.6 (*)    All other components within normal limits  HEPATIC FUNCTION PANEL    EKG None  Radiology CT EXTREMITY LOWER RIGHT W CONTRAST  Result Date: 07/17/2023 CLINICAL DATA:  Right anterior groin and thigh cellulitis. EXAM: CT OF THE LOWER RIGHT EXTREMITY WITH CONTRAST TECHNIQUE: Multidetector CT imaging of the lower right extremity was performed according to the standard protocol  following intravenous contrast administration. RADIATION DOSE REDUCTION: This exam was performed according to the departmental dose-optimization program which includes automated exposure control, adjustment of the mA and/or kV according to patient size and/or use of iterative reconstruction technique. CONTRAST:  OMNIPAQUE IOHEXOL 350 MG/ML SOLN COMPARISON:  None Available. FINDINGS: Bones/Joint/Cartilage No fracture or dislocation. The hip joint is anatomically aligned. The knee joint is anatomically aligned with a joint effusion. Ligaments Ligaments are suboptimally evaluated by CT. Muscles and Tendons Muscles are normal. No muscle atrophy. No intramuscular fluid collection or hematoma. Soft tissue Cutaneous thickening of the right groin with underlying subcutaneous fat stranding and ill-defined fluid. No discrete rim enhancing collection is identified. Enlarged right inguinal lymph nodes measuring up to 1.3 cm in short axis (series 5, image 69). The visualized intrapelvic contents are grossly unremarkable. IMPRESSION: 1. Cellulitis of the right groin with underlying ill-defined fluid which could represent phlegmonous change. No drainable collection identified. 2. Enlarged right inguinal lymph nodes are likely reactive. Electronically Signed   By: Hart Robinsons M.D.   On: 07/17/2023 10:48    Procedures Ultrasound ED Soft Tissue  Date/Time: 07/17/2023 10:17 AM  Performed by: Blane Ohara, MD Authorized by: Blane Ohara, MD   Procedure details:    Indications: localization of abscess and evaluate for cellulitis     Transverse view:  Visualized   Longitudinal view:  Visualized   Images: archived   Location:    Location: lower extremity     Side:  Right Findings:     cellulitis present     Medications Ordered in ED Medications  acetaminophen (TYLENOL) tablet 1,000 mg (has no administration in time range)  ibuprofen (ADVIL) tablet 800 mg (800 mg Oral Given 07/17/23 0745)  morphine  (PF) 4 MG/ML injection 4 mg (4 mg Intravenous Given 07/17/23 0809)  doxycycline (VIBRA-TABS) tablet 100 mg (100 mg Oral Given 07/17/23 0808)  iohexol (OMNIPAQUE) 350 MG/ML injection 100 mL (100 mLs Intravenous Contrast Given 07/17/23 1610)    ED Course/ Medical Decision Making/ A&P                                 Medical Decision Making Amount and/or Complexity of Data Reviewed Labs: ordered. Radiology: ordered.  Risk OTC drugs. Prescription drug management.   Patient presents with clinical concern for cellulitis and possible abscess/phlegmon in the right thigh.  Patient denies IV drug use but does admit to amphetamine use.  Bedside ultrasound performed showing cellulitis and phlegmon. Plan for blood work, CT scan for further delineation to see how far it extends into look for any evidence  of free air.  Basic blood work ordered and independently reviewed normal white count electrolytes unremarkable.  Pain meds and doxycycline antibiotic ordered.  CT scan results independently reviewed no signs of abscess or deep tracking of infection.  Consistent with cellulitis.  Plan for doxycycline a recheck on Friday by primary doctor.  Reasons to return discussed.  Tylenol ordered for pain.        Final Clinical Impression(s) / ED Diagnoses Final diagnoses:  Cellulitis of right thigh  Skin irritation    Rx / DC Orders ED Discharge Orders          Ordered    doxycycline (VIBRAMYCIN) 100 MG capsule  2 times daily        07/17/23 1148              Blane Ohara, MD 07/17/23 1149

## 2023-07-17 NOTE — Discharge Instructions (Addendum)
It is very important for you to take antibiotics as prescribed and have a recheck on Friday at local doctor/urgent care.  For persistent fevers, rapidly spreading redness or new concerns return to emergency department.  Use Tylenol every 4 hours and ibuprofen every 6 as needed for pain.

## 2023-07-17 NOTE — ED Triage Notes (Signed)
Pt reporting he is raw between his toes and that he has a possible abscess in his groin area for about a week, denies drainage or fevers.
